# Patient Record
Sex: Female | Born: 1990 | Race: Black or African American | Hispanic: No | Marital: Single | Smoking: Never smoker
Health system: Southern US, Community
[De-identification: ages and names within clinical notes are randomized; demographics above are authoritative.]

## PROBLEM LIST (undated history)

## (undated) DIAGNOSIS — Q796 Ehlers-Danlos syndrome, unspecified: Secondary | ICD-10-CM

## (undated) DIAGNOSIS — R079 Chest pain, unspecified: Secondary | ICD-10-CM

## (undated) DIAGNOSIS — IMO0002 Reserved for concepts with insufficient information to code with codable children: Secondary | ICD-10-CM

## (undated) HISTORY — DX: Chest pain, unspecified: R07.9

## (undated) HISTORY — DX: Ehlers-Danlos syndrome, unspecified: Q79.60

## (undated) HISTORY — PX: WISDOM TOOTH EXTRACTION: SHX21

---
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
## 2010-08-26 ENCOUNTER — Inpatient Hospital Stay (HOSPITAL_COMMUNITY)
Admission: AD | Admit: 2010-08-26 | Discharge: 2010-08-26 | Disposition: A | Discharge status: Home / Self Care | Payer: BC Managed Care – PPO | Source: Ambulatory Visit | Attending: Obstetrics & Gynecology | Admitting: Obstetrics & Gynecology

## 2010-08-26 DIAGNOSIS — N949 Unspecified condition associated with female genital organs and menstrual cycle: Secondary | ICD-10-CM | POA: Insufficient documentation

## 2010-08-26 DIAGNOSIS — B3731 Acute candidiasis of vulva and vagina: Secondary | ICD-10-CM | POA: Insufficient documentation

## 2010-08-26 DIAGNOSIS — B373 Candidiasis of vulva and vagina: Secondary | ICD-10-CM | POA: Insufficient documentation

## 2010-08-26 LAB — WET PREP, GENITAL: Clue Cells Wet Prep HPF POC: NONE SEEN

## 2010-08-27 LAB — GC/CHLAMYDIA PROBE AMP, GENITAL: Chlamydia, DNA Probe: NEGATIVE

## 2010-09-17 ENCOUNTER — Emergency Department (HOSPITAL_COMMUNITY)
Admission: EM | Admit: 2010-09-17 | Discharge: 2010-09-17 | Disposition: A | Discharge status: Home / Self Care | Payer: No Typology Code available for payment source | Attending: Emergency Medicine | Admitting: Emergency Medicine

## 2010-09-17 DIAGNOSIS — M549 Dorsalgia, unspecified: Secondary | ICD-10-CM | POA: Insufficient documentation

## 2011-02-16 ENCOUNTER — Emergency Department (HOSPITAL_COMMUNITY): Payer: No Typology Code available for payment source

## 2011-02-16 ENCOUNTER — Emergency Department (HOSPITAL_COMMUNITY)
Admission: EM | Admit: 2011-02-16 | Discharge: 2011-02-16 | Disposition: A | Discharge status: Home / Self Care | Payer: No Typology Code available for payment source | Attending: Emergency Medicine | Admitting: Emergency Medicine

## 2011-02-16 DIAGNOSIS — M542 Cervicalgia: Secondary | ICD-10-CM | POA: Insufficient documentation

## 2011-02-16 DIAGNOSIS — Y92410 Unspecified street and highway as the place of occurrence of the external cause: Secondary | ICD-10-CM | POA: Insufficient documentation

## 2011-02-16 DIAGNOSIS — M545 Low back pain, unspecified: Secondary | ICD-10-CM | POA: Insufficient documentation

## 2011-02-16 DIAGNOSIS — M546 Pain in thoracic spine: Secondary | ICD-10-CM | POA: Insufficient documentation

## 2011-09-16 ENCOUNTER — Ambulatory Visit: Payer: No Typology Code available for payment source | Attending: Psychiatry

## 2011-09-16 DIAGNOSIS — M542 Cervicalgia: Secondary | ICD-10-CM | POA: Insufficient documentation

## 2011-09-16 DIAGNOSIS — IMO0001 Reserved for inherently not codable concepts without codable children: Secondary | ICD-10-CM | POA: Insufficient documentation

## 2011-09-16 DIAGNOSIS — R5381 Other malaise: Secondary | ICD-10-CM | POA: Insufficient documentation

## 2011-09-26 ENCOUNTER — Encounter: Payer: No Typology Code available for payment source | Admitting: Physical Therapy

## 2012-07-14 ENCOUNTER — Encounter (HOSPITAL_COMMUNITY): Payer: Self-pay | Admitting: Emergency Medicine

## 2012-07-14 ENCOUNTER — Emergency Department (HOSPITAL_COMMUNITY)
Admission: EM | Admit: 2012-07-14 | Discharge: 2012-07-14 | Disposition: A | Discharge status: Home / Self Care | Payer: BC Managed Care – PPO | Attending: Emergency Medicine | Admitting: Emergency Medicine

## 2012-07-14 ENCOUNTER — Emergency Department (HOSPITAL_COMMUNITY): Payer: BC Managed Care – PPO

## 2012-07-14 ENCOUNTER — Inpatient Hospital Stay (HOSPITAL_COMMUNITY)
Admission: AD | Admit: 2012-07-14 | Discharge: 2012-07-14 | Disposition: A | Discharge status: Home / Self Care | Payer: No Typology Code available for payment source | Source: Ambulatory Visit | Attending: Obstetrics & Gynecology | Admitting: Obstetrics & Gynecology

## 2012-07-14 DIAGNOSIS — R079 Chest pain, unspecified: Secondary | ICD-10-CM

## 2012-07-14 DIAGNOSIS — Z79899 Other long term (current) drug therapy: Secondary | ICD-10-CM | POA: Insufficient documentation

## 2012-07-14 HISTORY — DX: Reserved for concepts with insufficient information to code with codable children: IMO0002

## 2012-07-14 NOTE — ED Notes (Signed)
Pt presenting to ed with c/o chest pain x 8 hours. Pt denies shortness of breath, nausea and vomiting at this time

## 2012-07-14 NOTE — ED Notes (Signed)
Pt states that pain woke her up around 0200 this morning. States she took motrin and it relieved the pain slightly and she was able to go back to sleep.

## 2012-07-20 NOTE — ED Provider Notes (Signed)
History    21yf with CP. Onset around 0200 this morning. Constant since. Center of chest. Does not radiate. Took motrin which improved somewhat, but did not completely relief. No appreciable exacerbating or relieving factors otherwise. No cough. No sob. No fever or chills. No hx of blood clot. No unusual leg pain or swelling. Denies smoking or drug use.   CSN: [id number]  Arrival date & time 07/14/12  0910   First MD Initiated Contact with Patient 07/14/12 [phone]      Chief Complaint  Patient presents with  . Chest Pain    (Consider location/radiation/quality/duration/timing/severity/associated sxs/prior treatment) HPI  Past Medical History  Diagnosis Date  . Chiari malformation     Past Surgical History  Procedure Laterality Date  . Wisdom tooth extraction      No family history on file.  History  Substance Use Topics  . Smoking status: Never Smoker   . Smokeless tobacco: Not on file  . Alcohol Use: No    OB History   Grav Para Term Preterm Abortions TAB SAB Ect Mult Living                  Review of Systems  All systems reviewed and negative, other than as noted in HPI.   Allergies  Latex  Home Medications   Current Outpatient Rx  Name  Route  Sig  Dispense  Refill  . albuterol (PROVENTIL HFA;VENTOLIN HFA) 108 (90 BASE) MCG/ACT inhaler   Inhalation   Inhale 2 puffs into the lungs every 6 (six) hours as needed for wheezing. For shortness of breath.         . Cholecalciferol 4000 UNITS CAPS   Oral   Take 1 capsule by mouth daily.         . drospirenone-ethinyl estradiol (YASMIN,ZARAH,SYEDA) 3-0.03 MG tablet   Oral   Take 1 tablet by mouth daily.         . Multiple Vitamin (MULTIVITAMIN WITH MINERALS) TABS   Oral   Take 1 tablet by mouth daily.           BP 124/87  Pulse 80  Temp(Src) 98.6 F (37 C) (Oral)  SpO2 100%  LMP 06/13/2012  Physical Exam  Nursing note and vitals reviewed. Constitutional: She appears well-developed and  well-nourished. No distress.  HENT:  Head: Normocephalic and atraumatic.  Eyes: Conjunctivae are normal. Right eye exhibits no discharge. Left eye exhibits no discharge.  Neck: Neck supple.  Cardiovascular: Normal rate, regular rhythm and normal heart sounds.  Exam reveals no gallop and no friction rub.   No murmur heard. Pulmonary/Chest: Effort normal and breath sounds normal. No respiratory distress. She exhibits tenderness.  Abdominal: Soft. She exhibits no distension. There is no tenderness.  Musculoskeletal: She exhibits no edema and no tenderness.  Lower extremities symmetric as compared to each other. No calf tenderness. Negative Homan's. No palpable cords.   Neurological: She is alert.  Skin: Skin is warm and dry.  Psychiatric: She has a normal mood and affect. Her behavior is normal. Thought content normal.    ED Course  Procedures (including critical care time)  Labs Reviewed - No data to display No results found.  Dg Chest 2 View  07/14/2012  *RADIOLOGY REPORT*  Clinical Data: Chest pain.  CHEST - 2 VIEW  Comparison: None.  Findings: Heart and mediastinal contours are within normal limits. No focal opacities or effusions.  No acute bony abnormality.  IMPRESSION: No active cardiopulmonary disease.   Original Report  Authenticated By: Charlett Nose, M.D.     EKG:  Rhythm: normal sinus Vent. rate 74 BPM PR interval 156 ms QRS duration 86 ms QT/QTc 392/435 ms ST segments: TWI anterior precordial leads   1. Chest pain       MDM  21yF with CP. Very low suspicion for emergent etiology. May be musculoskeletal with reproducibility with palpation L parasternal border.  Pt is on OCP and has TWI anterior precordial leads, I do not think she has a PE though.  EKG otherwise ok. CXR clear. I do not feel further emergent testing is needed at this time. I discussed emergent return precautions with pt. I feel she is safe for discharge at this time for outpt FU.         Raeford Razor, MD 07/20/12 [phone]

## 2014-03-07 ENCOUNTER — Emergency Department (HOSPITAL_COMMUNITY): Payer: BC Managed Care – PPO

## 2014-03-07 ENCOUNTER — Emergency Department (HOSPITAL_COMMUNITY)
Admission: EM | Admit: 2014-03-07 | Discharge: 2014-03-07 | Disposition: A | Discharge status: Home / Self Care | Payer: BC Managed Care – PPO | Attending: Emergency Medicine | Admitting: Emergency Medicine

## 2014-03-07 ENCOUNTER — Encounter (HOSPITAL_COMMUNITY): Payer: Self-pay | Admitting: Emergency Medicine

## 2014-03-07 DIAGNOSIS — R0789 Other chest pain: Secondary | ICD-10-CM

## 2014-03-07 DIAGNOSIS — Z91040 Latex allergy status: Secondary | ICD-10-CM | POA: Diagnosis not present

## 2014-03-07 DIAGNOSIS — Z79899 Other long term (current) drug therapy: Secondary | ICD-10-CM | POA: Insufficient documentation

## 2014-03-07 DIAGNOSIS — Z862 Personal history of diseases of the blood and blood-forming organs and certain disorders involving the immune mechanism: Secondary | ICD-10-CM | POA: Diagnosis not present

## 2014-03-07 DIAGNOSIS — R079 Chest pain, unspecified: Secondary | ICD-10-CM | POA: Diagnosis present

## 2014-03-07 DIAGNOSIS — Q0700 Arnold-Chiari syndrome without spina bifida or hydrocephalus: Secondary | ICD-10-CM | POA: Diagnosis not present

## 2014-03-07 LAB — CBC
HCT: 31.2 % — ABNORMAL LOW (ref 36.0–46.0)
HEMOGLOBIN: 10.4 g/dL — AB (ref 12.0–15.0)
MCH: 26.2 pg (ref 26.0–34.0)
MCHC: 33.3 g/dL (ref 30.0–36.0)
MCV: 78.6 fL (ref 78.0–100.0)
PLATELETS: 288 10*3/uL (ref 150–400)
RBC: 3.97 MIL/uL (ref 3.87–5.11)
RDW: 13.3 % (ref 11.5–15.5)
WBC: 4.7 10*3/uL (ref 4.0–10.5)

## 2014-03-07 LAB — BASIC METABOLIC PANEL
Anion gap: 14 (ref 5–15)
BUN: 13 mg/dL (ref 6–23)
CALCIUM: 8.9 mg/dL (ref 8.4–10.5)
CO2: 21 mEq/L (ref 19–32)
CREATININE: 0.66 mg/dL (ref 0.50–1.10)
Chloride: 99 mEq/L (ref 96–112)
GFR calc Af Amer: >90 mL/min (ref >90)
GLUCOSE: 96 mg/dL (ref 70–99)
Potassium: 4.2 mEq/L (ref 3.7–5.3)
SODIUM: 134 meq/L — AB (ref 137–147)

## 2014-03-07 LAB — I-STAT BETA HCG BLOOD, ED (MC, WL, AP ONLY): I-stat hCG, quantitative: <5 m[IU]/mL (ref <5)

## 2014-03-07 LAB — I-STAT TROPONIN, ED: TROPONIN I, POC: 0 ng/mL (ref 0.00–0.08)

## 2014-03-07 LAB — D-DIMER, QUANTITATIVE (NOT AT ARMC): D DIMER QUANT: 0.55 ug{FEU}/mL — AB (ref 0.00–0.48)

## 2014-03-07 MED ORDER — IOHEXOL 350 MG/ML SOLN
100.0000 mL | Freq: Once | INTRAVENOUS | Status: AC | PRN
  Administered 2014-03-07: 100 mL via INTRAVENOUS

## 2014-03-07 MED ORDER — IBUPROFEN 200 MG PO TABS
400.0000 mg | ORAL_TABLET | Freq: Once | ORAL | Status: AC
  Administered 2014-03-07: 400 mg via ORAL
  Filled 2014-03-07: qty 2

## 2014-03-07 NOTE — ED Provider Notes (Signed)
CSN: [id number]     Arrival date & time 03/07/14  1522 History   First MD Initiated Contact with Patient 03/07/14 1607     Chief Complaint  Patient presents with  . Chest Pain     (Consider location/radiation/quality/duration/timing/severity/associated sxs/prior Treatment) HPI  Elaine Peterson is a 23 y.o. female with past medical history significant for Chiari malformation and anemia complaining of acute onset of left anterior chest pain 2 weeks ago, it was severe at onset, nonradiating, rated at a 10, described as sharp. Since that time it has been aching and exacerbated by laying flat. Patient denies history of DVT or PE, calf pain or leg swelling, smoking history, palpitations,  family history of early cardiac death, cough, fever, shortness of breath, abdominal pain, nausea vomiting. She's taking no pain medication prior to arrival. Patient takes oral contraceptive regularly. States that she had a four-hour trip to [place] on September 30 and back on October 4. Pain is 10 out of 10 at onset, it is 4/10 when she is sitting up and 6/10 when she lies down.   Past Medical History  Diagnosis Date  . Chiari malformation    Past Surgical History  Procedure Laterality Date  . Wisdom tooth extraction     History reviewed. No pertinent family history. History  Substance Use Topics  . Smoking status: Never Smoker   . Smokeless tobacco: Not on file  . Alcohol Use: No   OB History   Grav Para Term Preterm Abortions TAB SAB Ect Mult Living                 Review of Systems  10 systems reviewed and found to be negative, except as noted in the HPI.    Allergies  Latex  Home Medications   Prior to Admission medications   Medication Sig Start Date End Date Taking? Authorizing Provider  albuterol (PROVENTIL HFA;VENTOLIN HFA) 108 (90 BASE) MCG/ACT inhaler Inhale 2 puffs into the lungs every 6 (six) hours as needed for wheezing. For shortness of breath.   Yes Historical Provider, MD   Cholecalciferol (VITAMIN D PO) Take 1 capsule by mouth every other day.   Yes Historical Provider, MD  drospirenone-ethinyl estradiol (YASMIN,ZARAH,SYEDA) 3-0.03 MG tablet Take 1 tablet by mouth daily.   Yes Historical Provider, MD   BP 138/91  Pulse 86  Temp(Src) 98.2 F (36.8 C) (Oral)  Resp 15  SpO2 100%  LMP 02/22/2014 Physical Exam  Nursing note and vitals reviewed. Constitutional: She is oriented to person, place, and time. She appears well-developed and well-nourished. No distress.  HENT:  Head: Normocephalic and atraumatic.  Mouth/Throat: Oropharynx is clear and moist.  Eyes: Conjunctivae and EOM are normal. Pupils are equal, round, and reactive to light.  Cardiovascular: Normal rate, regular rhythm and intact distal pulses.  Exam reveals no gallop and no friction rub.   No murmur heard. Pulmonary/Chest: Effort normal and breath sounds normal. No stridor. No respiratory distress. She has no wheezes. She has no rales. She exhibits no tenderness.  Abdominal: Soft. Bowel sounds are normal. She exhibits no distension and no mass. There is no tenderness. There is no rebound and no guarding.  Musculoskeletal: Normal range of motion. She exhibits no edema and no tenderness.  No calf asymmetry, superficial collaterals, palpable cords, edema, Homans sign negative bilaterally.    Neurological: She is alert and oriented to person, place, and time.  Psychiatric: She has a normal mood and affect.    ED Course  Procedures (  including critical care time) Labs Review Labs Reviewed  CBC - Abnormal; Notable for the following:    Hemoglobin 10.4 (*)    HCT 31.2 (*)    All other components within normal limits  BASIC METABOLIC PANEL - Abnormal; Notable for the following:    Sodium 134 (*)    All other components within normal limits  D-DIMER, QUANTITATIVE - Abnormal; Notable for the following:    D-Dimer, Quant 0.55 (*)    All other components within normal limits  I-STAT TROPOININ, ED   I-STAT BETA HCG BLOOD, ED ([facility], [facility], [facility] ONLY)    Imaging Review Dg Chest 2 View  03/07/2014   CLINICAL DATA:  LEFT-SIDED CHEST PAIN FOR 2 WEEKS.  EXAM: CHEST  2 VIEW  COMPARISON:  07/14/2012  FINDINGS: Heart size is normal. Lungs are free of focal consolidations and pleural effusions. No pulmonary edema. Visualized osseous structures have a normal appearance.  IMPRESSION: No active cardiopulmonary disease.   Electronically Signed   By: Rosalie GumsBeth  Brown M.D.   On: 03/07/2014 16:49   Ct Angio Chest Pe W/cm &/or Wo Cm  03/07/2014   CLINICAL DATA:  Mid chest pain radiating to left side x 2 weeks, pt states pain has become worse, recent air travel, pt is on birth control, elevated d-dimer. Initial encounter.  EXAM: CT ANGIOGRAPHY CHEST WITH CONTRAST  TECHNIQUE: Multidetector CT imaging of the chest was performed using the standard protocol during bolus administration of intravenous contrast. Multiplanar CT image reconstructions and MIPs were obtained to evaluate the vascular anatomy.  CONTRAST:  100mL OMNIPAQUE IOHEXOL 350 MG/ML SOLN  COMPARISON:  Chest radiograph of earlier today.  No prior CT.  FINDINGS: Lungs/Pleura:  Mild left base atelectasis.  No lobar consolidation.  No pleural fluid.  Heart/Mediastinum: The quality of this examination for evaluation of pulmonary embolism is good. No evidence of pulmonary embolism. Heart size upper normal, accentuated by a mild pectus excavatum deformity. Normal aortic caliber without dissection. No mediastinal or hilar adenopathy. Minimal residual thymic tissue in the anterior mediastinum.  Upper Abdomen: Normal imaged portions of the liver, spleen, stomach, left kidney.  Bones/Musculoskeletal:  No acute osseous abnormality.  Review of the MIP images confirms the above findings.  IMPRESSION: No evidence of pulmonary embolism or other explanation for chest pain.   Electronically Signed   By: Jeronimo GreavesKyle  Talbot M.D.   On: 03/07/2014 18:14     EKG Interpretation   Date/Time:   Tuesday March 07 2014 15:45:32 EDT Ventricular Rate:  73 PR Interval:  155 QRS Duration: 82 QT Interval:  387 QTC Calculation: 426 R Axis:   75 Text Interpretation:  Sinus rhythm Baseline wander in lead(s) III aVL aVF  no STEMI. equivocal ST slurring V5. Borderline EKG. Confirmed by Donnald GarrePfeiffer,  MD, Lebron ConnersMarcy [phone]([id number]) on 03/07/2014 3:54:46 PM      MDM   Final diagnoses:  Atypical chest pain    Filed Vitals:   03/07/14 1531 03/07/14 1815  BP: 128/79 138/91  Pulse: 89 86  Temp: 98.2 F (36.8 C)   TempSrc: Oral   Resp: 16 15  SpO2: 100% 100%    Medications  ibuprofen (ADVIL,MOTRIN) tablet 400 mg (not administered)  iohexol (OMNIPAQUE) 350 MG/ML injection 100 mL (100 mLs Intravenous Contrast Given 03/07/14 1754)    Jestina Arnetha Peterson is a 23 y.o. female presenting with acute onset of severe left-sided chest pain 2 weeks ago, states that now it is moderate and aching, it is exacerbated by laying flat. Chest is nontender,  pain is not reproducible. She is not tachycardic, she is saturating well on room air, patient takes oral birth control pills, she had a recent long trip. D-dimer, basic blood work chest x-ray and EKG ordered. Patient is afebrile and well-appearing. No rub and EKG is not consistent with pericarditis. Patient declines pain medication several times.  Chest x-ray and blood work with no acute findings. However d-dimer is elevated. CT to rule out PE pending.  CT with no signs of pulmonary embolism. Advised patient to take Motrin 400 every 6.  Evaluation does not show pathology that would require ongoing emergent intervention or inpatient treatment. Pt is hemodynamically stable and mentating appropriately. Discussed findings and plan with patient/guardian, who agrees with care plan. All questions answered. Return precautions discussed and outpatient follow up given.       Wynetta Emery, PA-C 03/07/14 1850

## 2014-03-07 NOTE — ED Notes (Signed)
Pt states she has had L sided CP x 2 weeks. Nothing exacerbates/relieves it. Told by pcp to come in. Alert and oriented. NAD at this time.

## 2014-03-07 NOTE — Discharge Instructions (Signed)
For pain control please take Ibuprofen (also known as Motrin or Advil) 400mg  (this is normally 2 over the counter pills) every 6 hours. Take with food to minimize stomach irritation.  Please follow with your primary care doctor in the next 2 days for a check-up. They must obtain records for further management.   Do not hesitate to return to the Emergency Department for any new, worsening or concerning symptoms.    Chest Pain (Nonspecific) It is often hard to give a specific diagnosis for the cause of chest pain. There is always a chance that your pain could be related to something serious, such as a heart attack or a blood clot in the lungs. You need to follow up with your health care provider for further evaluation. CAUSES   Heartburn.  Pneumonia or bronchitis.  Anxiety or stress.  Inflammation around your heart (pericarditis) or lung (pleuritis or pleurisy).  A blood clot in the lung.  A collapsed lung (pneumothorax). It can develop suddenly on its own (spontaneous pneumothorax) or from trauma to the chest.  Shingles infection (herpes zoster virus). The chest wall is composed of bones, muscles, and cartilage. Any of these can be the source of the pain.  The bones can be bruised by injury.  The muscles or cartilage can be strained by coughing or overwork.  The cartilage can be affected by inflammation and become sore (costochondritis). DIAGNOSIS  Lab tests or other studies may be needed to find the cause of your pain. Your health care provider may have you take a test called an ambulatory electrocardiogram (ECG). An ECG records your heartbeat patterns over a 24-hour period. You may also have other tests, such as:  Transthoracic echocardiogram (TTE). During echocardiography, sound waves are used to evaluate how blood flows through your heart.  Transesophageal echocardiogram (TEE).  Cardiac monitoring. This allows your health care provider to monitor your heart rate and rhythm in  real time.  Holter monitor. This is a portable device that records your heartbeat and can help diagnose heart arrhythmias. It allows your health care provider to track your heart activity for several days, if needed.  Stress tests by exercise or by giving medicine that makes the heart beat faster. TREATMENT   Treatment depends on what may be causing your chest pain. Treatment may include:  Acid blockers for heartburn.  Anti-inflammatory medicine.  Pain medicine for inflammatory conditions.  Antibiotics if an infection is present.  You may be advised to change lifestyle habits. This includes stopping smoking and avoiding alcohol, caffeine, and chocolate.  You may be advised to keep your head raised (elevated) when sleeping. This reduces the chance of acid going backward from your stomach into your esophagus. Most of the time, nonspecific chest pain will improve within 2-3 days with rest and mild pain medicine.  HOME CARE INSTRUCTIONS   If antibiotics were prescribed, take them as directed. Finish them even if you start to feel better.  For the next few days, avoid physical activities that bring on chest pain. Continue physical activities as directed.  Do not use any tobacco products, including cigarettes, chewing tobacco, or electronic cigarettes.  Avoid drinking alcohol.  Only take medicine as directed by your health care provider.  Follow your health care provider's suggestions for further testing if your chest pain does not go away.  Keep any follow-up appointments you made. If you do not go to an appointment, you could develop lasting (chronic) problems with pain. If there is any problem keeping  an appointment, call to reschedule. SEEK MEDICAL CARE IF:   Your chest pain does not go away, even after treatment.  You have a rash with blisters on your chest.  You have a fever. SEEK IMMEDIATE MEDICAL CARE IF:   You have increased chest pain or pain that spreads to your arm,  neck, jaw, back, or abdomen.  You have shortness of breath.  You have an increasing cough, or you cough up blood.  You have severe back or abdominal pain.  You feel nauseous or vomit.  You have severe weakness.  You faint.  You have chills. This is an emergency. Do not wait to see if the pain will go away. Get medical help at once. Call your local emergency services (911 in [place] Do not drive yourself to the hospital. MAKE SURE YOU:   Understand these instructions.  Will watch your condition.  Will get help right away if you are not doing well or get worse. Document Released: 02/19/2005 Document Revised: 05/17/2013 Document Reviewed: 12/16/2007 [facility] Patient Information 2015 [employer] [place]. This information is not intended to replace advice given to you by your health care provider. Make sure you discuss any questions you have with your health care provider.

## 2014-03-11 NOTE — ED Provider Notes (Signed)
Medical screening examination/treatment/procedure(s) were performed by non-physician practitioner and as supervising physician I was immediately available for consultation/collaboration.   EKG Interpretation   Date/Time:  Tuesday March 07 2014 15:45:32 EDT Ventricular Rate:  73 PR Interval:  155 QRS Duration: 82 QT Interval:  387 QTC Calculation: 426 R Axis:   75 Text Interpretation:  Sinus rhythm Baseline wander in lead(s) III aVL aVF  no STEMI. equivocal ST slurring V5. Borderline EKG. Confirmed by Donnald GarrePfeiffer,  MD, Lebron ConnersMarcy [phone]([id number] on 03/07/2014 3:54:46 PM Also confirmed by Donnald GarrePfeiffer, MD,  Lebron ConnersMarcy [phone]), editor [place]  CCT, [place] ([id number])  on 03/08/2014  7:47:57 AM       Arby BarretteMarcy Barnie Sopko, MD 03/11/14 0028

## 2014-04-06 ENCOUNTER — Ambulatory Visit: Payer: BC Managed Care – PPO | Admitting: Cardiovascular Disease

## 2014-05-11 ENCOUNTER — Ambulatory Visit (INDEPENDENT_AMBULATORY_CARE_PROVIDER_SITE_OTHER): Payer: BC Managed Care – PPO | Admitting: Cardiovascular Disease

## 2014-05-11 ENCOUNTER — Encounter: Payer: Self-pay | Admitting: Cardiovascular Disease

## 2014-05-11 VITALS — BP 98/76 | HR 76 | Ht 62.5 in | Wt 135.8 lb

## 2014-05-11 DIAGNOSIS — R079 Chest pain, unspecified: Secondary | ICD-10-CM | POA: Insufficient documentation

## 2014-05-11 DIAGNOSIS — R0789 Other chest pain: Secondary | ICD-10-CM

## 2014-05-11 NOTE — Assessment & Plan Note (Signed)
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 CT angio was negative for PE.  I have encouraged her to continue to be very active. She is scheduled to have an [place] in [place] in January. I've asked her to bring me a copy of the actual echo on CD. I will see her in 1 year.

## 2014-05-11 NOTE — Patient Instructions (Signed)
Your physician recommends that you continue on your current medications as directed. Please refer to the Current Medication list given to you today.  Your physician wants you to follow-up in: 1 year with Dr. Nahser.  You will receive a reminder letter in the mail two months in advance. If you don't receive a letter, please call our office to schedule the follow-up appointment.  

## 2014-05-11 NOTE — Progress Notes (Signed)
     Elaine Peterson Date of Birth  03/19/1991       [facility]    [place] [facility] [address], [address]  [address], [address] [place], [place]  [address]   [place], [place]  [id number] [phone]     [phone]   Fax  [phone]     Fax [phone]  Problem List: 1. Chest pain 2. Chiari Malformation 3. EDS (Ehlers-Danlos syndrome)  History of Present Illness:  Elaine Peterson is a 23 yo who is self referred for chest pain.   She has hypermobility III and has been [facility].   She was seen in the ER with CP on Oct.  Heaviness, like a balloon in her chest.  Has had similar pains in the past.  Also has some sharp pain and felt that her heart was racing.  CT angio was negative for PE.   Works at [employer]  ETOH - "cooks with  wine "   Non smoker  Fhx:  No cardiac issues     Current Outpatient Prescriptions on File Prior to Visit  Medication Sig Dispense Refill  . albuterol (PROVENTIL HFA;VENTOLIN HFA) 108 (90 BASE) MCG/ACT inhaler Inhale 2 puffs into the lungs every 6 (six) hours as needed for wheezing. For shortness of breath.    . Cholecalciferol (VITAMIN D PO) Take 1 capsule by mouth every other day.    . drospirenone-ethinyl estradiol (YASMIN,ZARAH,SYEDA) 3-0.03 MG tablet Take 1 tablet by mouth daily.     No current facility-administered medications on file prior to visit.    Allergies  Allergen Reactions  . Latex Hives, Swelling and Rash    Past Medical History  Diagnosis Date  . Chiari malformation   . Chest pain   . EDS (Ehlers-Danlos syndrome) 2010    Past Surgical History  Procedure Laterality Date  . Wisdom tooth extraction      History  Smoking status  . Never Smoker   Smokeless tobacco  . Not on file    History  Alcohol Use No    Family History  Problem Relation Age of Onset  . Hypertension Mother   . Hyperthyroidism Mother   . Hypertension Father   . Hyperlipidemia Father   .  Hypothyroidism Maternal Grandmother   . Breast cancer Paternal Grandmother   . CVA Paternal Grandmother   . Prostate cancer Paternal Grandfather     Reviw of Systems:  Reviewed in the HPI.  All other systems are negative.  Physical Exam: Blood pressure 98/76, pulse 76, height 5' 2.5" (1.588 m), weight 135 lb 12.8 oz (61.598 kg). Wt Readings from Last 3 Encounters:  05/11/14 135 lb 12.8 oz (61.598 kg)     General: Well developed, well nourished, in no acute distress.  Head: Normocephalic, atraumatic, sclera non-icteric, mucus membranes are moist,   Neck: Supple. Carotids are 2 + without bruits. No JVD   Lungs: Clear   Heart: RR, soft diastolic murmur  Abdomen: Soft, non-tender, non-distended with normal bowel sounds.  Msk:  Strength and tone are normal   Extremities: No clubbing or cyanosis. No edema.  Distal pedal pulses are 2+ and equal    Neuro: CN II - XII intact.  Alert and oriented X 3.   Psych:  Normal   ECG: Oct. 13, 2015:  NSR no ST or T wave changes.   Assessment / Plan:

## 2015-03-09 ENCOUNTER — Ambulatory Visit: Payer: BLUE CROSS/BLUE SHIELD

## 2015-04-14 ENCOUNTER — Ambulatory Visit (INDEPENDENT_AMBULATORY_CARE_PROVIDER_SITE_OTHER): Payer: BLUE CROSS/BLUE SHIELD | Admitting: Family Medicine

## 2015-04-14 VITALS — BP 120/78 | HR 75 | Temp 98.5°F | Resp 16 | Ht 62.5 in | Wt 147.0 lb

## 2015-04-14 DIAGNOSIS — Z0000 Encounter for general adult medical examination without abnormal findings: Secondary | ICD-10-CM | POA: Diagnosis not present

## 2015-04-14 NOTE — Patient Instructions (Signed)
Health Maintenance, Female Adopting a healthy lifestyle and getting preventive care can go a long way to promote health and wellness. Talk with your health care provider about what schedule of regular examinations is right for you. This is a good chance for you to check in with your provider about disease prevention and staying healthy. In between checkups, there are plenty of things you can do on your own. Experts have done a lot of research about which lifestyle changes and preventive measures are most likely to keep you healthy. Ask your health care provider for more information. WEIGHT AND DIET  Eat a healthy diet  Be sure to include plenty of vegetables, fruits, low-fat dairy products, and lean protein.  Do not eat a lot of foods high in solid fats, added sugars, or salt.  Get regular exercise. This is one of the most important things you can do for your health.  Most adults should exercise for at least 150 minutes each week. The exercise should increase your heart rate and make you sweat (moderate-intensity exercise).  Most adults should also do strengthening exercises at least twice a week. This is in addition to the moderate-intensity exercise.  Maintain a healthy weight  Body mass index (BMI) is a measurement that can be used to identify possible weight problems. It estimates body fat based on height and weight. Your health care provider can help determine your BMI and help you achieve or maintain a healthy weight.  For females 20 years of age and older:   A BMI below 18.5 is considered underweight.  A BMI of 18.5 to 24.9 is normal.  A BMI of 25 to 29.9 is considered overweight.  A BMI of 30 and above is considered obese.  Watch levels of cholesterol and blood lipids  You should start having your blood tested for lipids and cholesterol at 25 years of age, then have this test every 5 years.  You may need to have your cholesterol levels checked more often if:  Your lipid  or cholesterol levels are high.  You are older than 25 years of age.  You are at high risk for heart disease.  CANCER SCREENING   Lung Cancer  Lung cancer screening is recommended for adults 55-80 years old who are at high risk for lung cancer because of a history of smoking.  A yearly low-dose CT scan of the lungs is recommended for people who:  Currently smoke.  Have quit within the past 15 years.  Have at least a 30-pack-year history of smoking. A pack year is smoking an average of one pack of cigarettes a day for 1 year.  Yearly screening should continue until it has been 15 years since you quit.  Yearly screening should stop if you develop a health problem that would prevent you from having lung cancer treatment.  Breast Cancer  Practice breast self-awareness. This means understanding how your breasts normally appear and feel.  It also means doing regular breast self-exams. Let your health care provider know about any changes, no matter how small.  If you are in your 20s or 30s, you should have a clinical breast exam (CBE) by a health care provider every 1-3 years as part of a regular health exam.  If you are 40 or older, have a CBE every year. Also consider having a breast X-ray (mammogram) every year.  If you have a family history of breast cancer, talk to your health care provider about genetic screening.  If you   are at high risk for breast cancer, talk to your health care provider about having an MRI and a mammogram every year.  Breast cancer gene (BRCA) assessment is recommended for women who have family members with BRCA-related cancers. BRCA-related cancers include:  Breast.  Ovarian.  Tubal.  Peritoneal cancers.  Results of the assessment will determine the need for genetic counseling and BRCA1 and BRCA2 testing. Cervical Cancer Your health care provider may recommend that you be screened regularly for cancer of the pelvic organs (ovaries, uterus, and  vagina). This screening involves a pelvic examination, including checking for microscopic changes to the surface of your cervix (Pap test). You may be encouraged to have this screening done every 3 years, beginning at age 21.  For women ages 30-65, health care providers may recommend pelvic exams and Pap testing every 3 years, or they may recommend the Pap and pelvic exam, combined with testing for human papilloma virus (HPV), every 5 years. Some types of HPV increase your risk of cervical cancer. Testing for HPV may also be done on women of any age with unclear Pap test results.  Other health care providers may not recommend any screening for nonpregnant women who are considered low risk for pelvic cancer and who do not have symptoms. Ask your health care provider if a screening pelvic exam is right for you.  If you have had past treatment for cervical cancer or a condition that could lead to cancer, you need Pap tests and screening for cancer for at least 20 years after your treatment. If Pap tests have been discontinued, your risk factors (such as having a new sexual partner) need to be reassessed to determine if screening should resume. Some women have medical problems that increase the chance of getting cervical cancer. In these cases, your health care provider may recommend more frequent screening and Pap tests. Colorectal Cancer  This type of cancer can be detected and often prevented.  Routine colorectal cancer screening usually begins at 25 years of age and continues through 25 years of age.  Your health care provider may recommend screening at an earlier age if you have risk factors for colon cancer.  Your health care provider may also recommend using home test kits to check for hidden blood in the stool.  A small camera at the end of a tube can be used to examine your colon directly (sigmoidoscopy or colonoscopy). This is done to check for the earliest forms of colorectal  cancer.  Routine screening usually begins at age 50.  Direct examination of the colon should be repeated every 5-10 years through 25 years of age. However, you may need to be screened more often if early forms of precancerous polyps or small growths are found. Skin Cancer  Check your skin from head to toe regularly.  Tell your health care provider about any new moles or changes in moles, especially if there is a change in a mole's shape or color.  Also tell your health care provider if you have a mole that is larger than the size of a pencil eraser.  Always use sunscreen. Apply sunscreen liberally and repeatedly throughout the day.  Protect yourself by wearing long sleeves, pants, a wide-brimmed hat, and sunglasses whenever you are outside. HEART DISEASE, DIABETES, AND HIGH BLOOD PRESSURE   High blood pressure causes heart disease and increases the risk of stroke. High blood pressure is more likely to develop in:  People who have blood pressure in the high end   of the normal range (130-139/85-89 mm Hg).  People who are overweight or obese.  People who are African American.  If you are 18-39 years of age, have your blood pressure checked every 3-5 years. If you are 40 years of age or older, have your blood pressure checked every year. You should have your blood pressure measured twice--once when you are at a hospital or clinic, and once when you are not at a hospital or clinic. Record the average of the two measurements. To check your blood pressure when you are not at a hospital or clinic, you can use:  An automated blood pressure machine at a pharmacy.  A home blood pressure monitor.  If you are between 55 years and 79 years old, ask your health care provider if you should take aspirin to prevent strokes.  Have regular diabetes screenings. This involves taking a blood sample to check your fasting blood sugar level.  If you are at a normal weight and have a low risk for diabetes,  have this test once every three years after 25 years of age.  If you are overweight and have a high risk for diabetes, consider being tested at a younger age or more often. PREVENTING INFECTION  Hepatitis B  If you have a higher risk for hepatitis B, you should be screened for this virus. You are considered at high risk for hepatitis B if:  You were born in a country where hepatitis B is common. Ask your health care provider which countries are considered high risk.  Your parents were born in a high-risk country, and you have not been immunized against hepatitis B (hepatitis B vaccine).  You have HIV or AIDS.  You use needles to inject street drugs.  You live with someone who has hepatitis B.  You have had sex with someone who has hepatitis B.  You get hemodialysis treatment.  You take certain medicines for conditions, including cancer, organ transplantation, and autoimmune conditions. Hepatitis C  Blood testing is recommended for:  Everyone born from 1945 through 1965.  Anyone with known risk factors for hepatitis C. Sexually transmitted infections (STIs)  You should be screened for sexually transmitted infections (STIs) including gonorrhea and chlamydia if:  You are sexually active and are younger than 24 years of age.  You are older than 24 years of age and your health care provider tells you that you are at risk for this type of infection.  Your sexual activity has changed since you were last screened and you are at an increased risk for chlamydia or gonorrhea. Ask your health care provider if you are at risk.  If you do not have HIV, but are at risk, it may be recommended that you take a prescription medicine daily to prevent HIV infection. This is called pre-exposure prophylaxis (PrEP). You are considered at risk if:  You are sexually active and do not regularly use condoms or know the HIV status of your partner(s).  You take drugs by injection.  You are sexually  active with a partner who has HIV. Talk with your health care provider about whether you are at high risk of being infected with HIV. If you choose to begin PrEP, you should first be tested for HIV. You should then be tested every 3 months for as long as you are taking PrEP.  PREGNANCY   If you are premenopausal and you may become pregnant, ask your health care provider about preconception counseling.  If you may   become pregnant, take 400 to 800 micrograms (mcg) of folic acid every day.  If you want to prevent pregnancy, talk to your health care provider about birth control (contraception). OSTEOPOROSIS AND MENOPAUSE   Osteoporosis is a disease in which the bones lose minerals and strength with aging. This can result in serious bone fractures. Your risk for osteoporosis can be identified using a bone density scan.  If you are 65 years of age or older, or if you are at risk for osteoporosis and fractures, ask your health care provider if you should be screened.  Ask your health care provider whether you should take a calcium or vitamin D supplement to lower your risk for osteoporosis.  Menopause may have certain physical symptoms and risks.  Hormone replacement therapy may reduce some of these symptoms and risks. Talk to your health care provider about whether hormone replacement therapy is right for you.  HOME CARE INSTRUCTIONS   Schedule regular health, dental, and eye exams.  Stay current with your immunizations.   Do not use any tobacco products including cigarettes, chewing tobacco, or electronic cigarettes.  If you are pregnant, do not drink alcohol.  If you are breastfeeding, limit how much and how often you drink alcohol.  Limit alcohol intake to no more than 1 drink per day for nonpregnant women. One drink equals 12 ounces of beer, 5 ounces of wine, or 1 ounces of hard liquor.  Do not use street drugs.  Do not share needles.  Ask your health care provider for help if  you need support or information about quitting drugs.  Tell your health care provider if you often feel depressed.  Tell your health care provider if you have ever been abused or do not feel safe at home.   This information is not intended to replace advice given to you by your health care provider. Make sure you discuss any questions you have with your health care provider.   Document Released: 11/25/2010 Document Revised: 06/02/2014 Document Reviewed: 04/13/2013 Elsevier Interactive Patient Education 2016 [employer]  

## 2015-04-14 NOTE — Progress Notes (Signed)
This chart was scribed for Elaine SidleKurt Dezaray Shibuya, MD by Watt Climesawaa Al Rifaie medical scribe at [facility].The patient was seen in exam room 12 and the patient's care was started at 10:35 AM.  Patient ID: Elaine JarvisRaven Peterson MRN: [id number], DOB: 12/13/1990, 24 y.o. Date of Encounter: 04/14/2015  Primary Physician: No primary care provider on file.  Chief Complaint:  Chief Complaint  Patient presents with  . Annual Exam    would like something in writing stated that she received her CPE    HPI:  Elaine AlbinoRaven Arnetha Peterson is a 24 y.o. female who presents to [facility] for a complete physical exam for work, pt is requesting a form that documents her visit.  Pt's last pap smear was this year. Pt notes that she already had a TB test performed.   Pt does not need blood work to be performed.   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   Past Medical History  Diagnosis Date  . Chiari malformation   . Chest pain   . EDS (Ehlers-Danlos syndrome) 2010     Home Meds: Prior to Admission medications   Medication Sig Start Date End Date Taking? Authorizing Provider  drospirenone-ethinyl estradiol (YASMIN,ZARAH,SYEDA) 3-0.03 MG tablet Take 1 tablet by mouth daily.   Yes Historical Provider, MD  albuterol (PROVENTIL HFA;VENTOLIN HFA) 108 (90 BASE) MCG/ACT inhaler Inhale 2 puffs into the lungs every 6 (six) hours as needed for wheezing. For shortness of breath.    Historical Provider, MD  Cholecalciferol (VITAMIN D PO) Take 1 capsule by mouth every other day.    Historical Provider, MD    Allergies:  Allergies  Allergen Reactions  . Latex Hives, Swelling and Rash    Social History   Social History  . Marital Status: Single    Spouse Name: Elaine Peterson  . Number of Children: Elaine Peterson  . Years of Education: Elaine Peterson   Occupational History  . Not on file.   Social History Main Topics  . Smoking status: Never Smoker   . Smokeless tobacco: Not on file  .  Alcohol Use: No  . Drug Use: No  . Sexual Activity: Not on file   Other Topics Concern  . Not on file   Social History Narrative     Review of Systems: Constitutional: negative for chills, fever, night sweats, weight changes, or fatigue  HEENT: negative for vision changes, hearing loss, congestion, rhinorrhea, ST, epistaxis, or sinus pressure Cardiovascular: negative for chest pain or palpitations Respiratory: negative for hemoptysis, wheezing, shortness of breath, or cough Abdominal: negative for abdominal pain, nausea, vomiting, diarrhea, or constipation Dermatological: negative for rash Neurologic: negative for headache, dizziness, or syncope All other systems reviewed and are otherwise negative with the exception to those above and in the HPI.  Physical Exam: Blood pressure 120/78, pulse 75, temperature 98.5 F (36.9 C), temperature source Oral, resp. rate 16, height 5' 2.5" (1.588 m), weight 147 lb (66.679 kg), last menstrual period 04/14/2015, SpO2 98 %., Body mass index is 26.44 kg/(m^2). General: Well developed, well nourished, in no acute distress. Head: Normocephalic, atraumatic, eyes without discharge, sclera non-icteric, nares are without discharge. Bilateral auditory canals clear, TM's are without perforation, pearly grey and translucent with reflective cone of light bilaterally. Oral cavity moist, posterior pharynx without exudate, erythema, peritonsillar abscess, or post nasal drip.  Neck: Supple. No thyromegaly. Full ROM. No lymphadenopathy. Lungs: Clear bilaterally to auscultation without wheezes, rales, or rhonchi.  Breathing is unlabored. Heart: RRR with S1 S2. No murmurs, rubs, or gallops appreciated. Abdomen: Soft, non-tender, non-distended with normoactive bowel sounds. No hepatomegaly. No rebound/guarding. No obvious abdominal masses. Msk:  Strength and tone normal for age. Extremities/Skin: Warm and dry. No clubbing or cyanosis. No edema. No rashes or suspicious  lesions. Neuro: Alert and oriented X 3. Moves all extremities spontaneously. Gait is normal. CNII-XII grossly in tact. Psych:  Responds to questions appropriately with a normal affect.     ASSESSMENT AND PLAN:  24 y.o. year old female with normal CPE    By signing my name below, I, Rawaa Al Rifaie, attest that this documentation has been prepared under the direction and in the presence of Elaine Sidle, MD.  Broadus John, Medical Scribe. 04/14/2015.  10:47 AM.  This chart was scribed in my presence and reviewed by me personally.    ICD-9-CM ICD-10-CM   1. Annual physical exam V70.0 Z00.00      Signed, Elaine Sidle, MD 04/14/2015 10:32 AM

## 2015-05-18 ENCOUNTER — Telehealth: Payer: Self-pay

## 2015-05-18 NOTE — Telephone Encounter (Signed)
PT called to check the status//Please fax document to employer and contact the patient when complete.  CB # [phone]

## 2015-05-18 NOTE — Telephone Encounter (Signed)
In your box

## 2015-05-18 NOTE — Telephone Encounter (Signed)
Pt was seen on 11/19 by Dr. Milus GlazierLauenstein for an Annual physical exam - Primary. She came by on 12/23 and dropped papers for Dr. Milus GlazierLauenstein to fill out on 12/23. She would like them faxed to # on paper. CB # [phone]

## 2015-05-23 ENCOUNTER — Telehealth: Payer: Self-pay

## 2015-05-23 NOTE — Telephone Encounter (Signed)
PT states she dropped off a from (on 05/17/2015) to be completed for annual physical OV performed in 03/2015; there is no documentation listed in EPIC/unable to locate form in Dr. Loma BostonLauenstein's mail box///  Pt needs call back, to be advised of next steps.... (did we find the form?, do we need an extra copy sent?, etc.) [phone] VM Okay

## 2015-05-28 NOTE — Telephone Encounter (Signed)
Left message to drop off form again.

## 2015-05-30 NOTE — Telephone Encounter (Signed)
I have the form, who can fill this out for Dr. Milus GlazierLauenstein?

## 2015-05-31 NOTE — Telephone Encounter (Signed)
Nevermind I found the form. Faxed form to her employer.

## 2015-08-02 IMAGING — CT CT ANGIO CHEST
1 of 2 series · 19 of 32 positions shown · IV contrast (omnipaque)
Comparison: Chest radiograph of earlier today.  No prior CT.

CLINICAL DATA: Mid chest pain radiating to left side x 2 weeks, pt
states pain has become worse, recent air travel, pt is on birth
control, elevated d-dimer. Initial encounter.

EXAM:
CT ANGIOGRAPHY CHEST WITH CONTRAST
TECHNIQUE: Multidetector CT imaging of the chest was performed using the
standard protocol during bolus administration of intravenous
contrast. Multiplanar CT image reconstructions and MIPs were
obtained to evaluate the vascular anatomy.
CONTRAST:  100mL OMNIPAQUE IOHEXOL 350 MG/ML SOLN

[Series 10: thins for pacs · axial · 0.59mm/px · z∈[+1298,+1478]mm · 19 of 202 slices shown]
[im 11/202  lung]
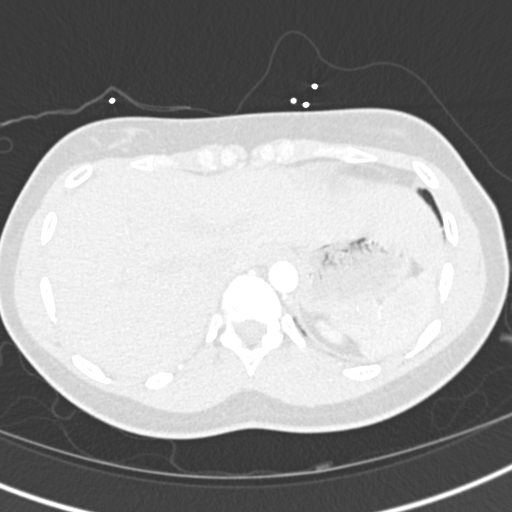
[im 21/202  mediastinal]
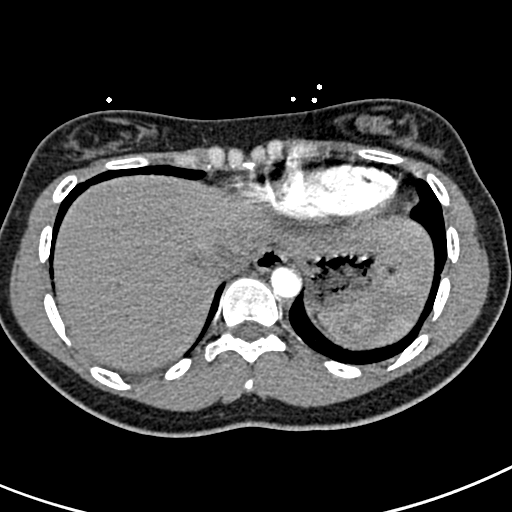
[im 31/202  lung]
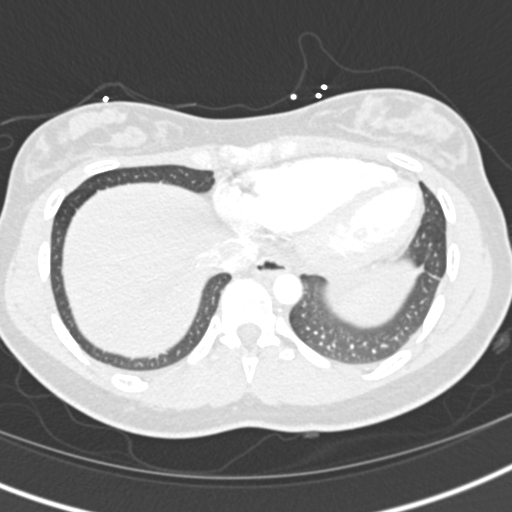
[im 51/202  mediastinal]
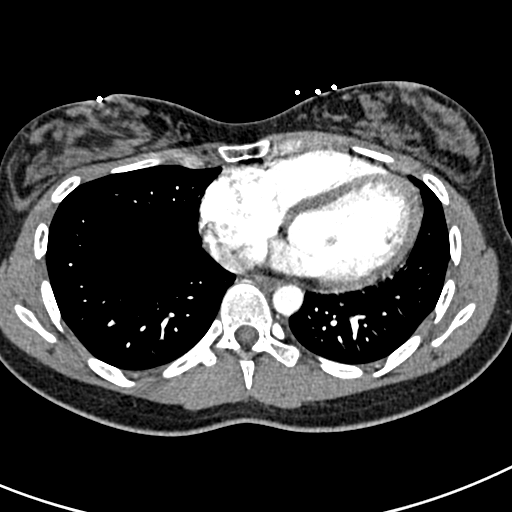
[im 61/202  lung]
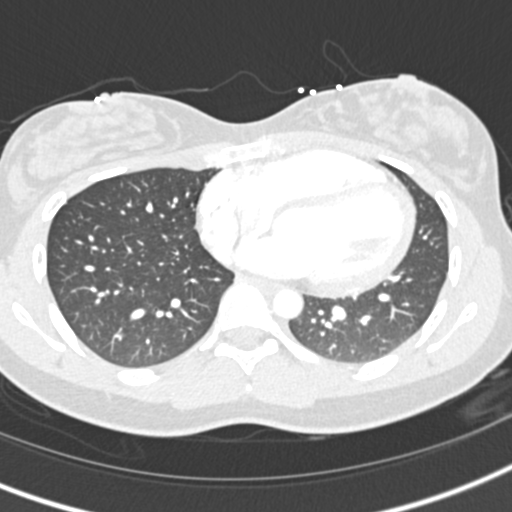
[im 68/202  mediastinal]
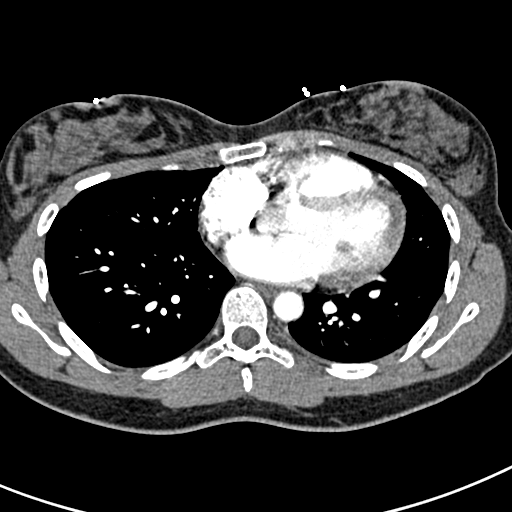
[im 71/202  lung]
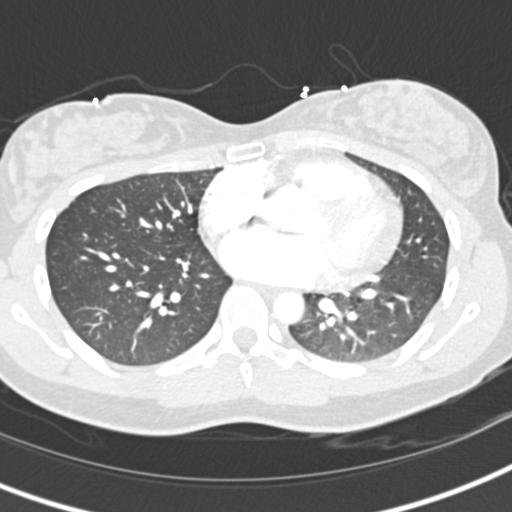
[im 81/202  mediastinal]
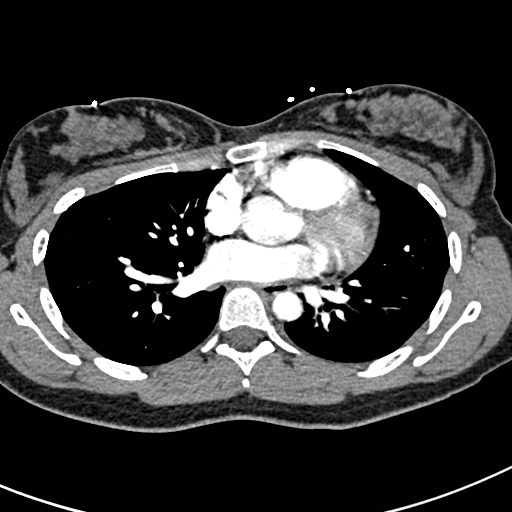
[im 91/202  lung]
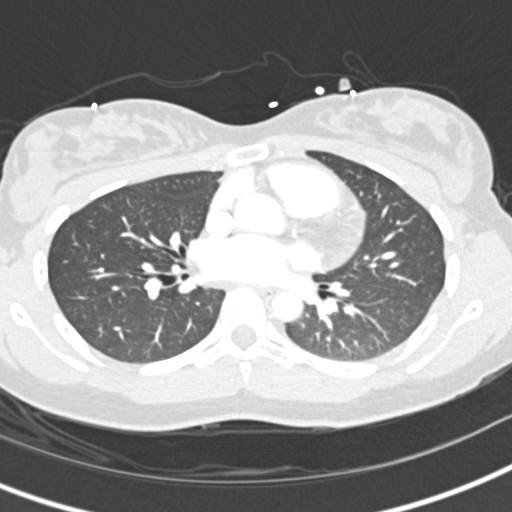
[im 101/202  mediastinal]
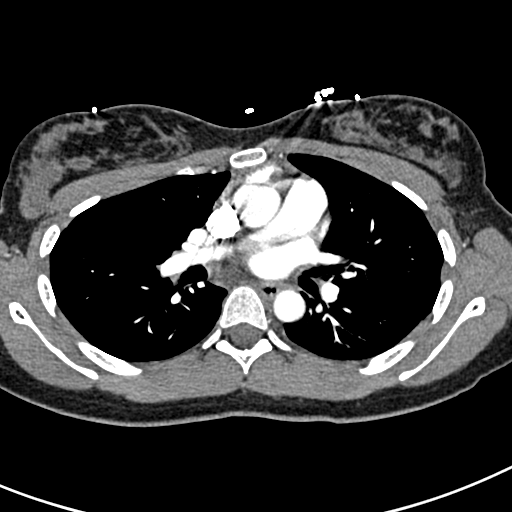
[im 111/202  lung]
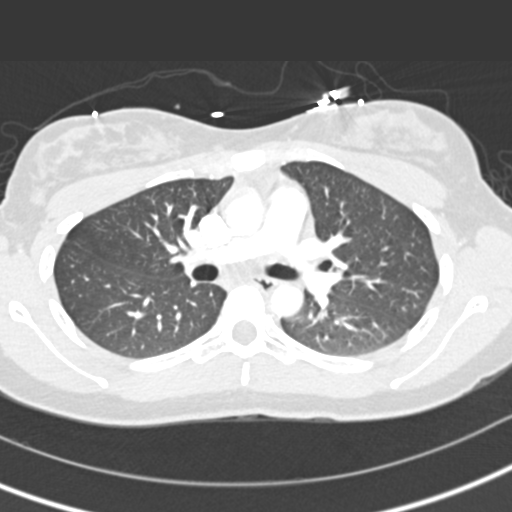
[im 121/202  mediastinal]
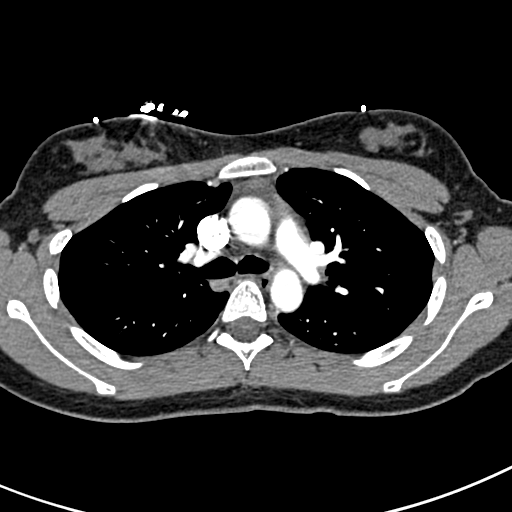
[im 131/202  lung]
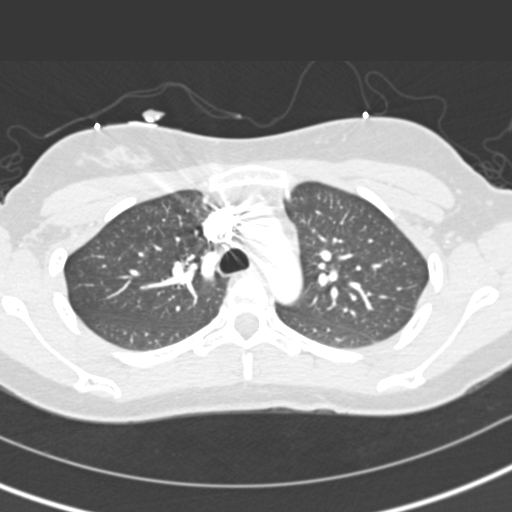
[im 135/202  mediastinal]
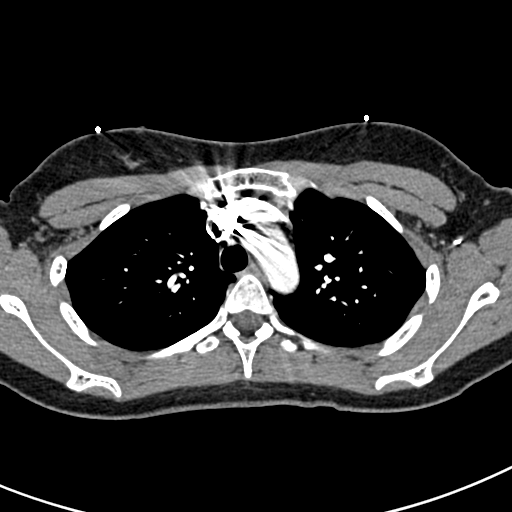
[im 141/202  lung]
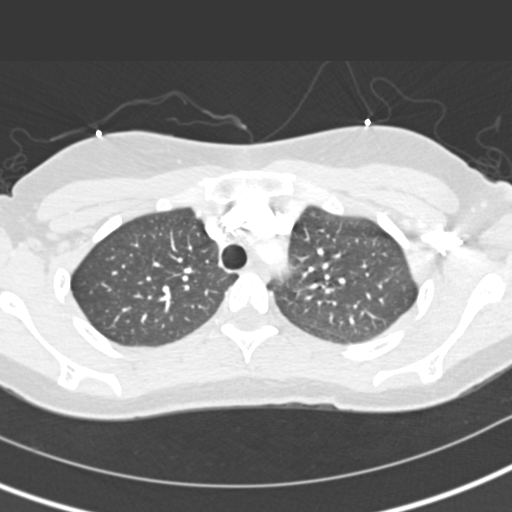
[im 151/202  mediastinal]
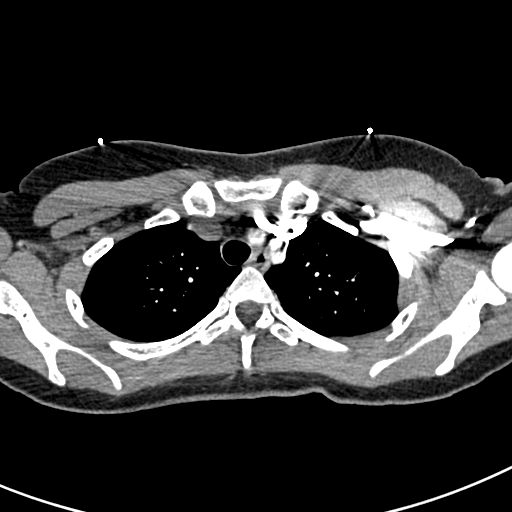
[im 171/202  lung]
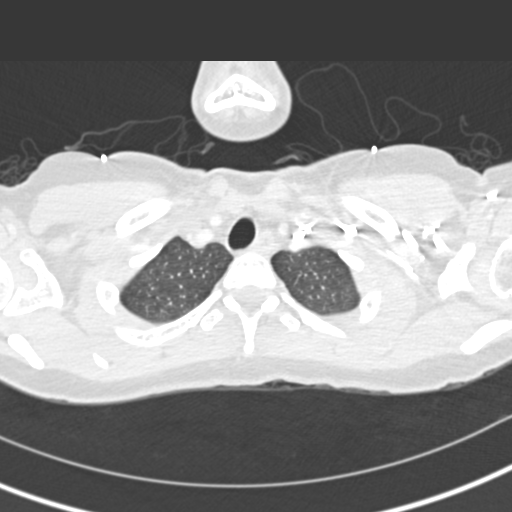
[im 181/202  mediastinal]
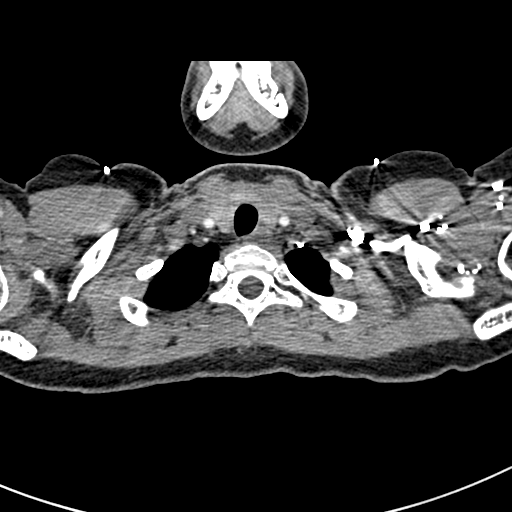
[im 191/202  lung]
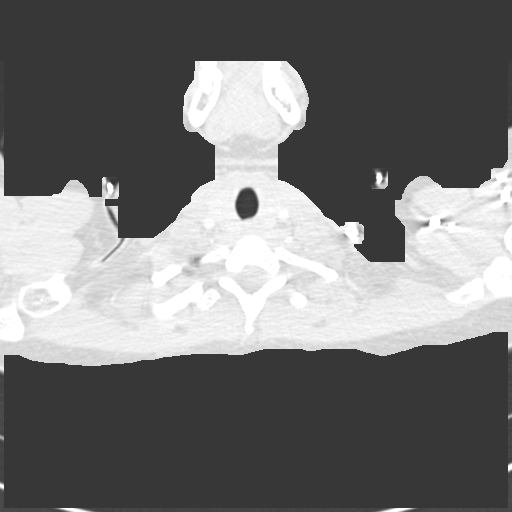

[19 of 32 positions shown; findings below may reference images not displayed]

FINDINGS: Lungs/Pleura:  Mild left base atelectasis.  No lobar consolidation.

No pleural fluid.

Heart/Mediastinum: The quality of this examination for evaluation of
pulmonary embolism is good. No evidence of pulmonary embolism. Heart
size upper normal, accentuated by a mild pectus excavatum deformity.
Normal aortic caliber without dissection. No mediastinal or hilar
adenopathy. Minimal residual thymic tissue in the anterior
mediastinum.

Upper Abdomen: Normal imaged portions of the liver, spleen, stomach,
left kidney.

Bones/Musculoskeletal:  No acute osseous abnormality.

Review of the MIP images confirms the above findings.
IMPRESSION: No evidence of pulmonary embolism or other explanation for chest
pain.
